# Patient Record
Sex: Female | Born: 1965 | Race: White | Hispanic: No | Marital: Married | Smoking: Never smoker
Health system: Southern US, Community
[De-identification: ages and names within clinical notes are randomized; demographics above are authoritative.]

---

## 2012-02-11 ENCOUNTER — Encounter (HOSPITAL_BASED_OUTPATIENT_CLINIC_OR_DEPARTMENT_OTHER): Payer: Self-pay

## 2012-02-11 ENCOUNTER — Emergency Department (HOSPITAL_BASED_OUTPATIENT_CLINIC_OR_DEPARTMENT_OTHER)
Admission: EM | Admit: 2012-02-11 | Discharge: 2012-02-11 | Disposition: A | Discharge status: Home / Self Care | Payer: 59 | Attending: Emergency Medicine | Admitting: Emergency Medicine

## 2012-02-11 DIAGNOSIS — R51 Headache: Secondary | ICD-10-CM | POA: Insufficient documentation

## 2012-02-11 MED ORDER — DIPHENHYDRAMINE HCL 50 MG/ML IJ SOLN
25.0000 mg | Freq: Once | INTRAMUSCULAR | Status: AC
  Administered 2012-02-11: 25 mg via INTRAVENOUS
  Filled 2012-02-11: qty 1

## 2012-02-11 MED ORDER — KETOROLAC TROMETHAMINE 15 MG/ML IJ SOLN
15.0000 mg | Freq: Once | INTRAMUSCULAR | Status: AC
  Administered 2012-02-11: 15 mg via INTRAVENOUS
  Filled 2012-02-11: qty 1

## 2012-02-11 MED ORDER — METOCLOPRAMIDE HCL 5 MG/ML IJ SOLN
10.0000 mg | Freq: Once | INTRAMUSCULAR | Status: AC
  Administered 2012-02-11: 10 mg via INTRAVENOUS
  Filled 2012-02-11: qty 2

## 2012-02-11 MED ORDER — SODIUM CHLORIDE 0.9 % IV BOLUS (SEPSIS)
1000.0000 mL | Freq: Once | INTRAVENOUS | Status: AC
  Administered 2012-02-11: 1000 mL via INTRAVENOUS

## 2012-02-11 MED ORDER — DROPERIDOL 2.5 MG/ML IJ SOLN
1.2500 mg | Freq: Once | INTRAMUSCULAR | Status: AC
  Administered 2012-02-11: 1.25 mg via INTRAVENOUS
  Filled 2012-02-11: qty 2

## 2012-02-11 NOTE — Discharge Instructions (Signed)
General Headache, Without Cause A general headache has no specific cause. These headaches are not life-threatening. They will not lead to other types of headaches. HOME CARE   Make and keep follow-up visits with your doctor.   Only take medicine as told by your doctor.   Try to relax, get a massage, or use your thoughts to control your body (biofeedback).   Apply cold or heat to the head and neck. Apply 3 or 4 times a day or as needed.  Finding out the results of your test Ask when your test results will be ready. Make sure you get your test results. GET HELP RIGHT AWAY IF:   You have problems with medicine.   Your medicine does not help relieve pain.   Your headache changes or becomes worse.   You feel sick to your stomach (nauseous) or throw up (vomit).   You have a temperature by mouth above 102 F (38.9 C), not controlled by medicine.   Your have a stiff neck.   You have vision loss.   You have muscle weakness.   You lose control of your muscles.   You lose balance or have trouble walking.   You feel like you are going to pass out (faint).  MAKE SURE YOU:   Understand these instructions.   Will watch this condition.   Will get help right away if you are not doing well or get worse.  Document Released: 09/22/2008 Document Revised: 08/26/2011 Document Reviewed: 09/22/2008 [employer] Patient Information 2012 [employer]. 

## 2012-02-11 NOTE — ED Notes (Signed)
MD at bedside. 

## 2012-02-11 NOTE — ED Provider Notes (Signed)
History    46 year old female presenting with headache. Patient has a history of what she calls migraines. This headache is similar in character to previous headaches. She has had increased frequency of these headaches though within the past 3 weeks. Most recent headache started yesterday. Gradual onset. Cannot remember what she was doing when she first noticed it. has been worse since early this morning. Constant. No appreciable exacerbating relieving factors. Mild nausea. No vomiting. No fevers or chills. Denies trauma. No neck pain or neck stiffness. No confusion per her husband. Patient is legally blind. No numbness, tingling or loss of strength. Denies history of cancer. Not on blood thinning medication.  CSN: [id number]  Arrival date & time 02/11/12  1914   First MD Initiated Contact with Patient 02/11/12 [phone]      No chief complaint on file.   (Consider location/radiation/quality/duration/timing/severity/associated sxs/prior treatment) HPI  No past medical history on file.  No past surgical history on file.  No family history on file.  History  Substance Use Topics  . Smoking status: Not on file  . Smokeless tobacco: Not on file  . Alcohol Use: Not on file    OB History    No data available      Review of Systems   Review of symptoms negative unless otherwise noted in HPI.   Allergies  Review of patient's allergies indicates not on file.  Home Medications  No current outpatient prescriptions on file.  BP 140/76  Pulse 69  Temp(Src) 97.8 F (36.6 C) (Oral)  Resp 16  Ht 5' 2.5" (1.588 m)  Wt 112 lb (50.803 kg)  BMI 20.16 kg/m2  SpO2 100%  Physical Exam  Nursing note and vitals reviewed. Constitutional: She is oriented to person, place, and time. She appears well-developed and well-nourished. No distress.  HENT:  Head: Normocephalic and atraumatic.  Eyes: Right eye exhibits no discharge. Left eye exhibits no discharge.       Disconjugate gaze  Neck:  Normal range of motion. Neck supple.       No midline spinal tenderness  Cardiovascular: Normal rate, regular rhythm and normal heart sounds.  Exam reveals no gallop and no friction rub.   No murmur heard. Pulmonary/Chest: Effort normal and breath sounds normal. No respiratory distress.  Abdominal: Soft. She exhibits no distension. There is no tenderness.  Musculoskeletal: She exhibits no edema and no tenderness.  Neurological: She is alert and oriented to person, place, and time. No cranial nerve deficit. She exhibits normal muscle tone. Coordination normal.       Patient is blind. Is no obvious cranial nerve deficit Otherwise Unable to assess finger to nose testing secondary to blindness. Heel-to-shin testing is normal bilaterally though.  Skin: Skin is warm and dry.  Psychiatric: She has a normal mood and affect. Her behavior is normal. Thought content normal.    ED Course  Procedures (including critical care time)  Labs Reviewed - No data to display No results found.  11:28 AM Patient reassessed. Patient states that her headache is much improved but she still has some mild pain. Patient states that she would like to go home pain-free. Explained her that this may not be reasonable but willing to try additional medications. Discussed other treatment options including narcotics, droperidol and others. Patient does not want narcotic at this time. We'll try a dose of droperidol and reassess.   1. Headache       MDM  46 year old female with headache. Similarity of current headache to  previous. Suspect primary headache. Consider secondary cause particularly with increased frequency. Consider mass, infectious or bleed but doubt. Consider carbon dioxide poisoning with time of year and frequency of headaches but no contacts with similar. Doubt carotid or vertebral artery dissection. Doubt ocular etiology. Aside from blindness neurological examination is otherwise nonfocal. She is at her  baseline mental status per her husband. Feel that neuroimaging would be very low yield without acute neurological change or history of trauma. Prior to discharge patient reports complete resolution of her headache. She has no new complaints. Return precautions were discussed. Outpatient followup.        Raeford Razor, MD 02/11/12 [phone]

## 2012-02-11 NOTE — ED Notes (Signed)
Attempted IV access x2 unsuccessful 

## 2012-02-11 NOTE — ED Notes (Signed)
Pt reports onset of "migraine" last night unrelieved after taking migraine medications.

## 2016-08-06 DIAGNOSIS — F411 Generalized anxiety disorder: Secondary | ICD-10-CM | POA: Insufficient documentation

## 2016-08-06 DIAGNOSIS — K219 Gastro-esophageal reflux disease without esophagitis: Secondary | ICD-10-CM | POA: Insufficient documentation

## 2016-08-06 DIAGNOSIS — H548 Legal blindness, as defined in USA: Secondary | ICD-10-CM | POA: Insufficient documentation

## 2016-08-06 DIAGNOSIS — E782 Mixed hyperlipidemia: Secondary | ICD-10-CM | POA: Insufficient documentation

## 2016-08-06 DIAGNOSIS — G43909 Migraine, unspecified, not intractable, without status migrainosus: Secondary | ICD-10-CM | POA: Insufficient documentation

## 2016-08-06 DIAGNOSIS — F410 Panic disorder [episodic paroxysmal anxiety] without agoraphobia: Secondary | ICD-10-CM | POA: Insufficient documentation

## 2016-09-05 ENCOUNTER — Emergency Department (HOSPITAL_BASED_OUTPATIENT_CLINIC_OR_DEPARTMENT_OTHER)
Admission: EM | Admit: 2016-09-05 | Discharge: 2016-09-05 | Disposition: A | Discharge status: Home / Self Care | Payer: BLUE CROSS/BLUE SHIELD | Attending: Emergency Medicine | Admitting: Emergency Medicine

## 2016-09-05 ENCOUNTER — Encounter (HOSPITAL_BASED_OUTPATIENT_CLINIC_OR_DEPARTMENT_OTHER): Payer: Self-pay | Admitting: Adult Health

## 2016-09-05 ENCOUNTER — Emergency Department (HOSPITAL_BASED_OUTPATIENT_CLINIC_OR_DEPARTMENT_OTHER): Payer: BLUE CROSS/BLUE SHIELD

## 2016-09-05 DIAGNOSIS — S59912A Unspecified injury of left forearm, initial encounter: Secondary | ICD-10-CM | POA: Diagnosis present

## 2016-09-05 DIAGNOSIS — S52125A Nondisplaced fracture of head of left radius, initial encounter for closed fracture: Secondary | ICD-10-CM | POA: Diagnosis not present

## 2016-09-05 DIAGNOSIS — Y9355 Activity, bike riding: Secondary | ICD-10-CM | POA: Diagnosis not present

## 2016-09-05 DIAGNOSIS — Z79899 Other long term (current) drug therapy: Secondary | ICD-10-CM | POA: Insufficient documentation

## 2016-09-05 DIAGNOSIS — S52122A Displaced fracture of head of left radius, initial encounter for closed fracture: Secondary | ICD-10-CM

## 2016-09-05 DIAGNOSIS — Y999 Unspecified external cause status: Secondary | ICD-10-CM | POA: Diagnosis not present

## 2016-09-05 DIAGNOSIS — Y92410 Unspecified street and highway as the place of occurrence of the external cause: Secondary | ICD-10-CM | POA: Insufficient documentation

## 2016-09-05 MED ORDER — HYDROCODONE-ACETAMINOPHEN 5-325 MG PO TABS: 2.0000 | ORAL_TABLET | Freq: Four times a day (QID) | ORAL | 0 refills | Status: AC | PRN

## 2016-09-05 MED ORDER — IBUPROFEN 800 MG PO TABS: 800.0000 mg | ORAL_TABLET | Freq: Four times a day (QID) | ORAL | 0 refills | Status: AC
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
## 2016-09-08 ENCOUNTER — Encounter: Payer: Self-pay | Admitting: Family Medicine

## 2016-09-08 ENCOUNTER — Ambulatory Visit (INDEPENDENT_AMBULATORY_CARE_PROVIDER_SITE_OTHER): Payer: BLUE CROSS/BLUE SHIELD | Admitting: Family Medicine

## 2016-09-08 DIAGNOSIS — S59902A Unspecified injury of left elbow, initial encounter: Secondary | ICD-10-CM

## 2016-09-08 NOTE — Patient Instructions (Addendum)
You have a radial head fracture but most of your pain is due to the swelling associated with this. Continue the motion exercises as you have been at least twice a day. Icing only as needed now 3-4 times a day for 15-20 minutes at a time. Ibuprofen only if needed now. Follow up with me in 2 weeks. We will likely add strengthening exercises with light weights at that time if you're doing well.

## 2016-09-10 DIAGNOSIS — S59902A Unspecified injury of left elbow, initial encounter: Secondary | ICD-10-CM | POA: Insufficient documentation

## 2016-09-10 NOTE — Assessment & Plan Note (Signed)
independently reviewed radiographs - effusion confirmed, subtle radial head fracture though not well seen on most views.  Emphasized motion exercises.  Icing and ibuprofen discussed.  F/u in 2 weeks - hope to add strengthening at that time if pain has continued to improve.
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
## 2016-09-22 ENCOUNTER — Ambulatory Visit (INDEPENDENT_AMBULATORY_CARE_PROVIDER_SITE_OTHER): Payer: BLUE CROSS/BLUE SHIELD | Admitting: Family Medicine

## 2016-09-22 ENCOUNTER — Encounter: Payer: Self-pay | Admitting: Family Medicine

## 2016-09-22 DIAGNOSIS — S59902D Unspecified injury of left elbow, subsequent encounter: Secondary | ICD-10-CM | POA: Diagnosis not present

## 2016-09-22 NOTE — Patient Instructions (Signed)
You have a radial head fracture. Continue the motion exercises as you have been at least twice a day. Wrist flexion and extension, arm curls, and hammer rotation exercises 3 sets of 10 once a day. Icing only as needed now 3-4 times a day for 15-20 minutes at a time. Ibuprofen only if needed. Follow up with me in 4 weeks.

## 2016-09-24 NOTE — Assessment & Plan Note (Signed)
independently reviewed radiographs previously with effusion, subtle radial head fracture.  She is doing well clinically - will add strengthening exercises which were shown to her today.  Icing, tylenol or ibuprofen if needed.  F/u in 4 weeks.  Consider physical therapy if not improving as expected.
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
## 2016-10-23 ENCOUNTER — Encounter (INDEPENDENT_AMBULATORY_CARE_PROVIDER_SITE_OTHER): Payer: Self-pay

## 2016-10-23 ENCOUNTER — Encounter: Payer: Self-pay | Admitting: Family Medicine

## 2016-10-23 ENCOUNTER — Ambulatory Visit (INDEPENDENT_AMBULATORY_CARE_PROVIDER_SITE_OTHER): Payer: BLUE CROSS/BLUE SHIELD | Admitting: Family Medicine

## 2016-10-23 ENCOUNTER — Ambulatory Visit: Payer: BLUE CROSS/BLUE SHIELD | Admitting: Family Medicine

## 2016-10-23 DIAGNOSIS — S59902D Unspecified injury of left elbow, subsequent encounter: Secondary | ICD-10-CM | POA: Diagnosis not present
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
## 2016-10-23 NOTE — Patient Instructions (Addendum)
Follow up with me as needed. You can return to the gym without restrictions.

## 2016-10-24 NOTE — Assessment & Plan Note (Signed)
Radial head fracture - clinically healed at this point.  Cleared for all activities without restrictions.  Ok to ice, take tylenol or ibuprofen only if needed.  F/u prn.

## 2017-10-20 IMAGING — DX DG ELBOW COMPLETE 3+V*L*
4 series · 4 of 4 positions shown · non-contrast
Comparison: None.

CLINICAL DATA: Pain after fall

EXAM:
LEFT ELBOW - COMPLETE 3+ VIEW

[elbow ap]
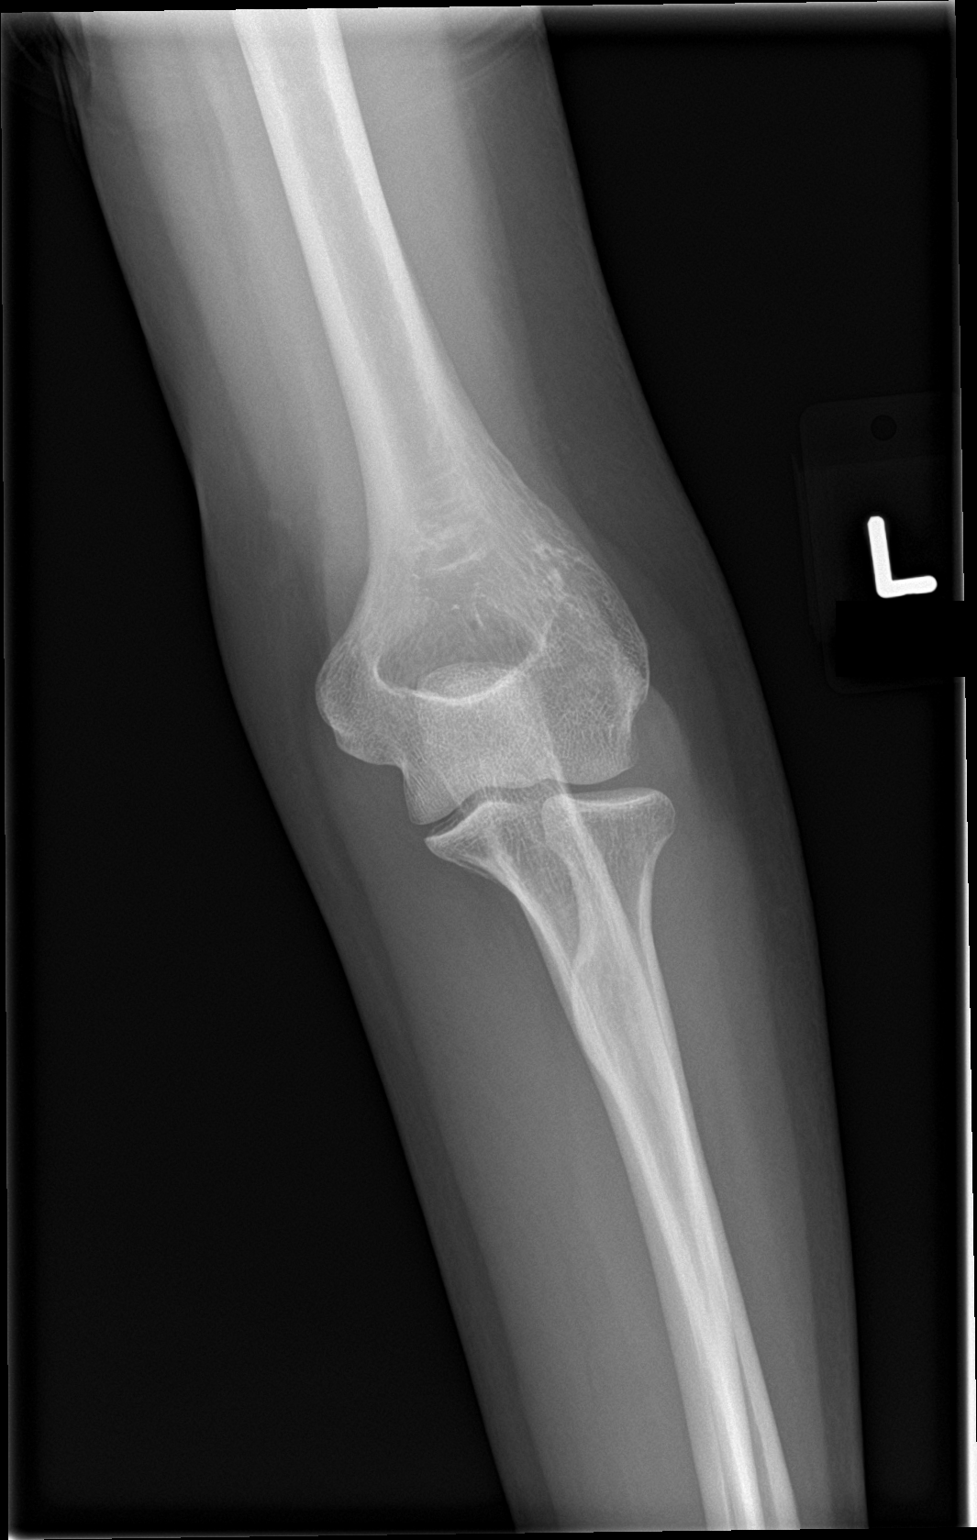

[elbow obl (1 of 2)]
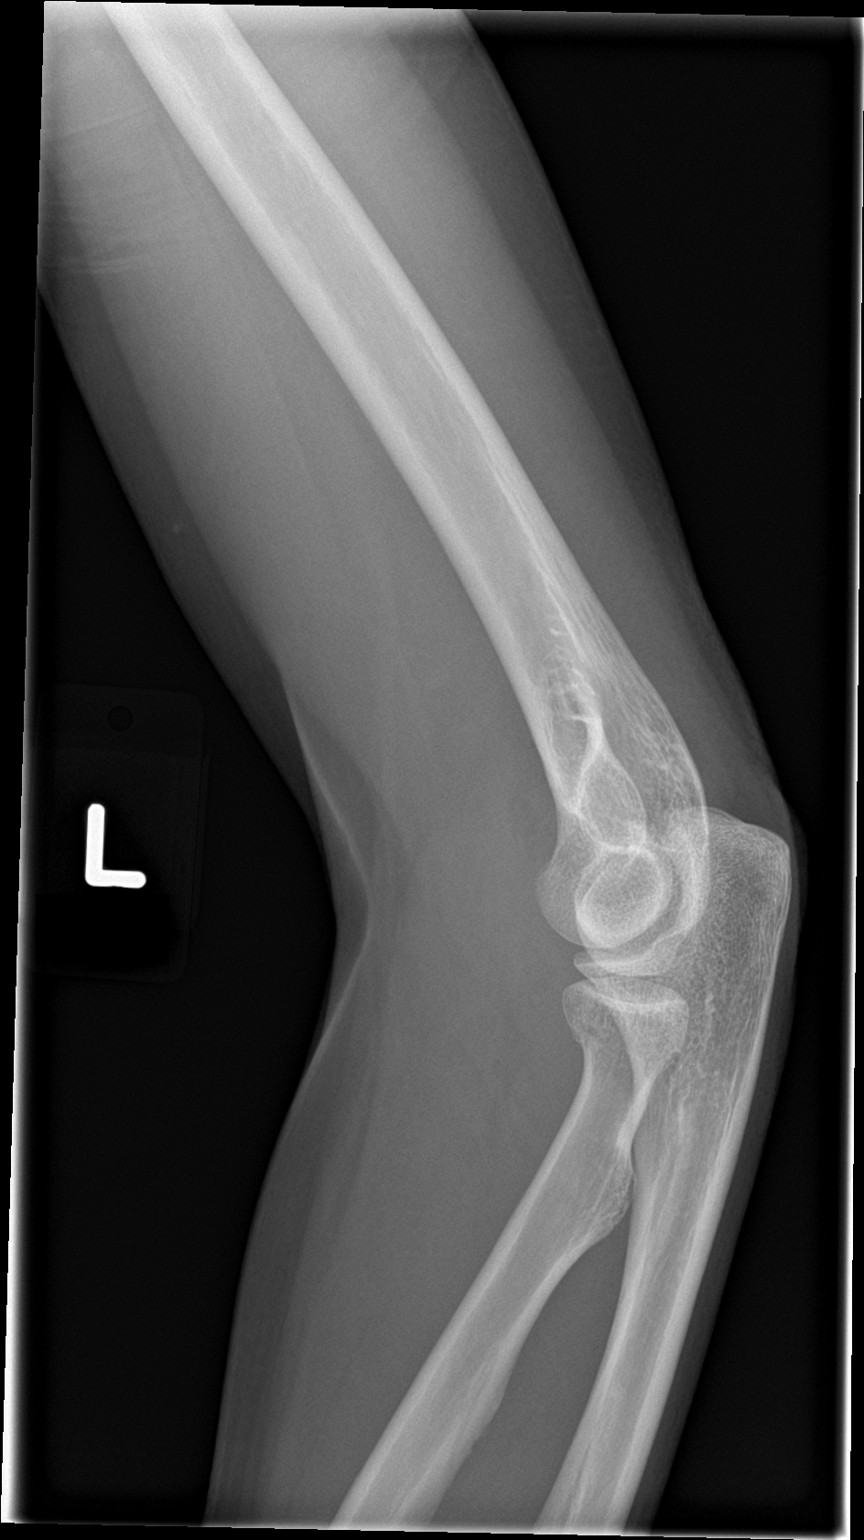

[elbow obl (2 of 2)]
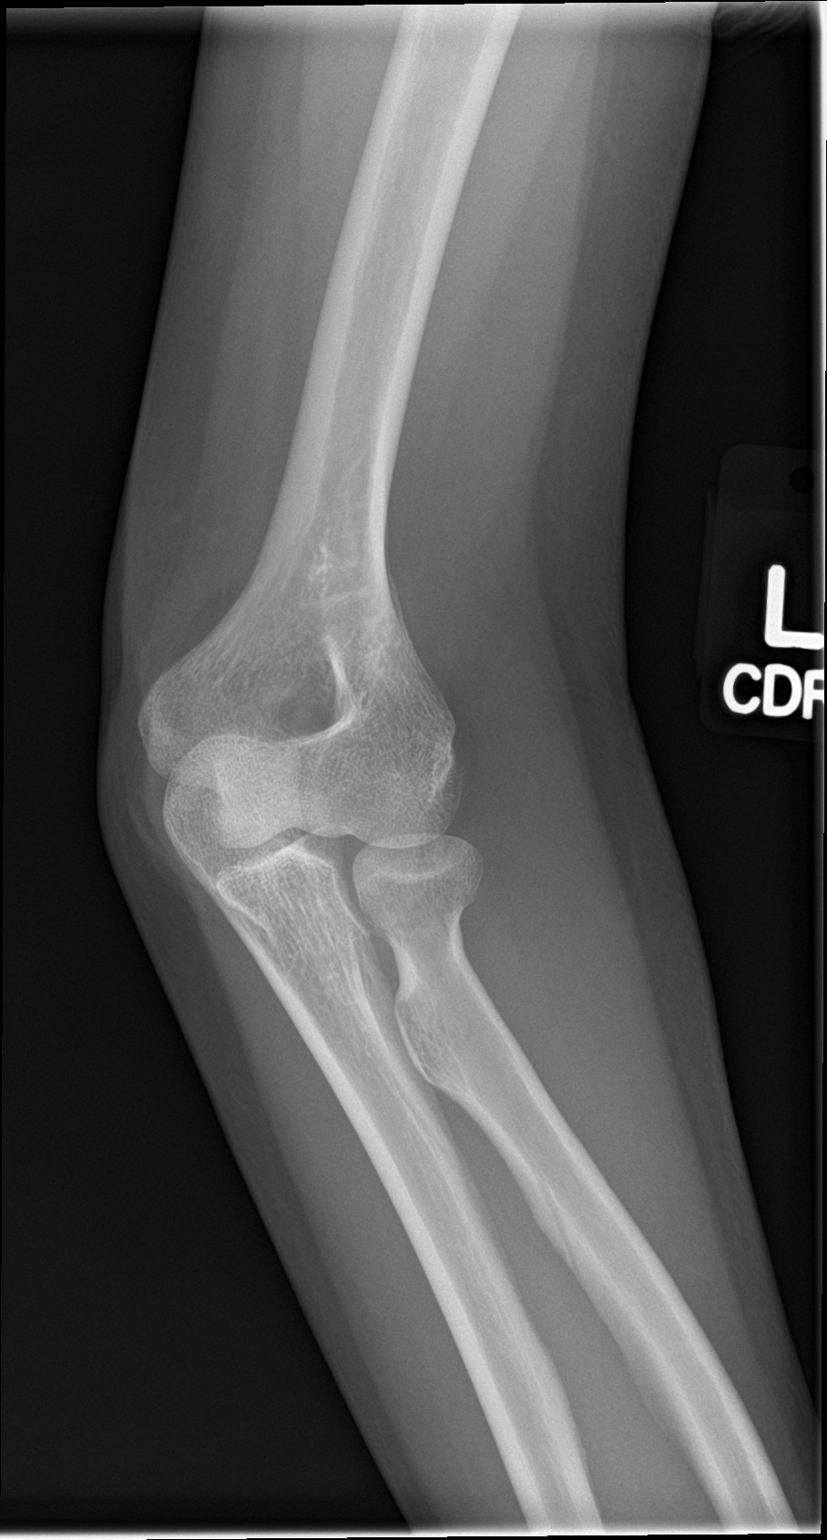

[elbow lat]
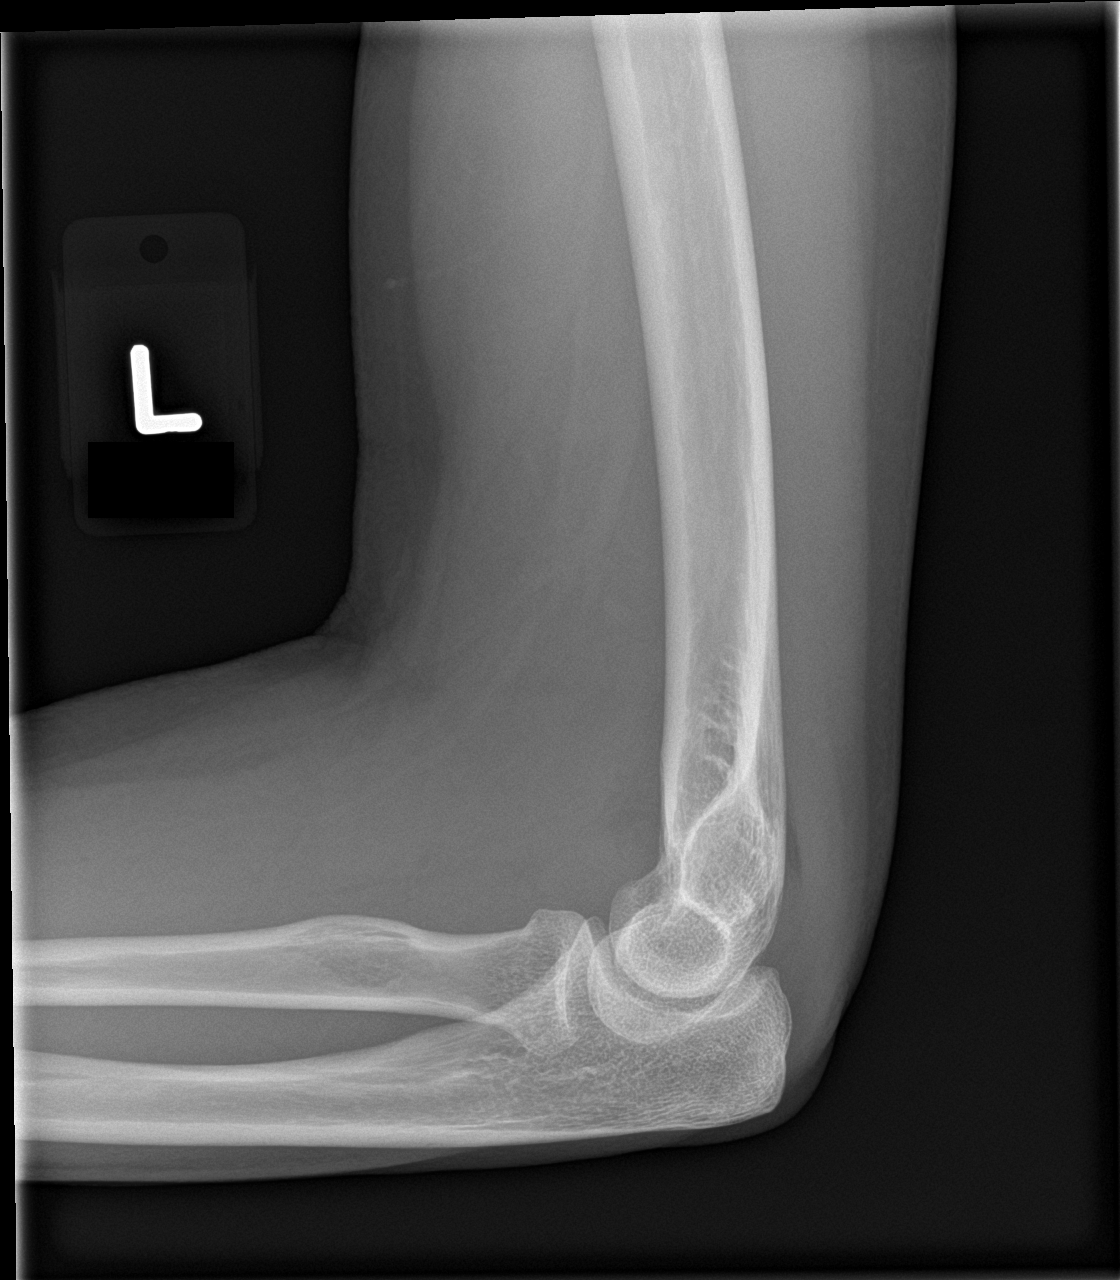

[4 of 4 positions shown; findings below may reference images not displayed]

FINDINGS: There is a subtle fracture through the radial head without
displacement. There is a resulting joint effusion.
IMPRESSION: Subtle nondisplaced radial head fracture with a resulting joint
effusion.

## 2020-01-23 DIAGNOSIS — L820 Inflamed seborrheic keratosis: Secondary | ICD-10-CM | POA: Diagnosis not present

## 2020-01-26 DIAGNOSIS — M8580 Other specified disorders of bone density and structure, unspecified site: Secondary | ICD-10-CM | POA: Diagnosis not present

## 2020-01-26 DIAGNOSIS — E782 Mixed hyperlipidemia: Secondary | ICD-10-CM | POA: Diagnosis not present

## 2020-01-30 DIAGNOSIS — F411 Generalized anxiety disorder: Secondary | ICD-10-CM | POA: Diagnosis not present

## 2020-01-30 DIAGNOSIS — F410 Panic disorder [episodic paroxysmal anxiety] without agoraphobia: Secondary | ICD-10-CM | POA: Diagnosis not present

## 2020-01-30 DIAGNOSIS — Z0000 Encounter for general adult medical examination without abnormal findings: Secondary | ICD-10-CM | POA: Diagnosis not present

## 2020-01-30 DIAGNOSIS — K219 Gastro-esophageal reflux disease without esophagitis: Secondary | ICD-10-CM | POA: Diagnosis not present

## 2020-01-30 DIAGNOSIS — Z79899 Other long term (current) drug therapy: Secondary | ICD-10-CM | POA: Diagnosis not present

## 2020-01-30 DIAGNOSIS — D709 Neutropenia, unspecified: Secondary | ICD-10-CM | POA: Diagnosis not present

## 2020-01-30 DIAGNOSIS — G43119 Migraine with aura, intractable, without status migrainosus: Secondary | ICD-10-CM | POA: Diagnosis not present

## 2020-01-30 DIAGNOSIS — Z136 Encounter for screening for cardiovascular disorders: Secondary | ICD-10-CM | POA: Diagnosis not present

## 2020-01-30 DIAGNOSIS — J340 Abscess, furuncle and carbuncle of nose: Secondary | ICD-10-CM | POA: Diagnosis not present

## 2020-01-30 DIAGNOSIS — E782 Mixed hyperlipidemia: Secondary | ICD-10-CM | POA: Diagnosis not present

## 2020-01-31 DIAGNOSIS — J340 Abscess, furuncle and carbuncle of nose: Secondary | ICD-10-CM | POA: Diagnosis not present

## 2020-01-31 DIAGNOSIS — R55 Syncope and collapse: Secondary | ICD-10-CM | POA: Diagnosis not present

## 2020-03-13 DIAGNOSIS — H3552 Pigmentary retinal dystrophy: Secondary | ICD-10-CM | POA: Diagnosis not present

## 2020-03-13 DIAGNOSIS — H25813 Combined forms of age-related cataract, bilateral: Secondary | ICD-10-CM | POA: Diagnosis not present

## 2020-03-13 DIAGNOSIS — H04123 Dry eye syndrome of bilateral lacrimal glands: Secondary | ICD-10-CM | POA: Diagnosis not present

## 2020-04-09 DIAGNOSIS — D709 Neutropenia, unspecified: Secondary | ICD-10-CM | POA: Diagnosis not present

## 2020-04-09 DIAGNOSIS — Z87440 Personal history of urinary (tract) infections: Secondary | ICD-10-CM | POA: Diagnosis not present

## 2020-04-16 DIAGNOSIS — L821 Other seborrheic keratosis: Secondary | ICD-10-CM | POA: Diagnosis not present

## 2020-04-16 DIAGNOSIS — L820 Inflamed seborrheic keratosis: Secondary | ICD-10-CM | POA: Diagnosis not present

## 2020-04-16 DIAGNOSIS — D485 Neoplasm of uncertain behavior of skin: Secondary | ICD-10-CM | POA: Diagnosis not present

## 2020-06-21 DIAGNOSIS — Z1231 Encounter for screening mammogram for malignant neoplasm of breast: Secondary | ICD-10-CM | POA: Diagnosis not present

## 2020-08-20 DIAGNOSIS — C4441 Basal cell carcinoma of skin of scalp and neck: Secondary | ICD-10-CM | POA: Diagnosis not present

## 2020-08-20 DIAGNOSIS — D1801 Hemangioma of skin and subcutaneous tissue: Secondary | ICD-10-CM | POA: Diagnosis not present

## 2020-08-20 DIAGNOSIS — L821 Other seborrheic keratosis: Secondary | ICD-10-CM | POA: Diagnosis not present

## 2020-08-20 DIAGNOSIS — L570 Actinic keratosis: Secondary | ICD-10-CM | POA: Diagnosis not present

## 2020-08-20 DIAGNOSIS — D225 Melanocytic nevi of trunk: Secondary | ICD-10-CM | POA: Diagnosis not present

## 2020-08-20 DIAGNOSIS — Z85828 Personal history of other malignant neoplasm of skin: Secondary | ICD-10-CM | POA: Diagnosis not present

## 2021-02-06 DIAGNOSIS — M8580 Other specified disorders of bone density and structure, unspecified site: Secondary | ICD-10-CM | POA: Diagnosis not present

## 2021-02-06 DIAGNOSIS — D709 Neutropenia, unspecified: Secondary | ICD-10-CM | POA: Diagnosis not present

## 2021-02-06 DIAGNOSIS — E782 Mixed hyperlipidemia: Secondary | ICD-10-CM | POA: Diagnosis not present

## 2021-02-11 DIAGNOSIS — N951 Menopausal and female climacteric states: Secondary | ICD-10-CM | POA: Diagnosis not present

## 2021-02-11 DIAGNOSIS — M8580 Other specified disorders of bone density and structure, unspecified site: Secondary | ICD-10-CM | POA: Diagnosis not present

## 2021-02-11 DIAGNOSIS — D708 Other neutropenia: Secondary | ICD-10-CM | POA: Diagnosis not present

## 2021-02-11 DIAGNOSIS — K219 Gastro-esophageal reflux disease without esophagitis: Secondary | ICD-10-CM | POA: Diagnosis not present

## 2021-02-11 DIAGNOSIS — F411 Generalized anxiety disorder: Secondary | ICD-10-CM | POA: Diagnosis not present

## 2021-02-11 DIAGNOSIS — F410 Panic disorder [episodic paroxysmal anxiety] without agoraphobia: Secondary | ICD-10-CM | POA: Diagnosis not present

## 2021-02-11 DIAGNOSIS — Z79899 Other long term (current) drug therapy: Secondary | ICD-10-CM | POA: Diagnosis not present

## 2021-02-11 DIAGNOSIS — G43119 Migraine with aura, intractable, without status migrainosus: Secondary | ICD-10-CM | POA: Diagnosis not present

## 2021-02-11 DIAGNOSIS — Z0000 Encounter for general adult medical examination without abnormal findings: Secondary | ICD-10-CM | POA: Diagnosis not present

## 2021-02-11 DIAGNOSIS — H548 Legal blindness, as defined in USA: Secondary | ICD-10-CM | POA: Diagnosis not present

## 2021-02-11 DIAGNOSIS — E782 Mixed hyperlipidemia: Secondary | ICD-10-CM | POA: Diagnosis not present

## 2021-02-13 DIAGNOSIS — Z85828 Personal history of other malignant neoplasm of skin: Secondary | ICD-10-CM | POA: Diagnosis not present

## 2021-02-13 DIAGNOSIS — L821 Other seborrheic keratosis: Secondary | ICD-10-CM | POA: Diagnosis not present

## 2021-02-13 DIAGNOSIS — D225 Melanocytic nevi of trunk: Secondary | ICD-10-CM | POA: Diagnosis not present

## 2021-02-13 DIAGNOSIS — C4441 Basal cell carcinoma of skin of scalp and neck: Secondary | ICD-10-CM | POA: Diagnosis not present

## 2021-02-27 DIAGNOSIS — H6122 Impacted cerumen, left ear: Secondary | ICD-10-CM | POA: Diagnosis not present

## 2021-05-06 DIAGNOSIS — R300 Dysuria: Secondary | ICD-10-CM | POA: Diagnosis not present

## 2021-06-23 DIAGNOSIS — M8589 Other specified disorders of bone density and structure, multiple sites: Secondary | ICD-10-CM | POA: Diagnosis not present

## 2021-06-23 DIAGNOSIS — Z780 Asymptomatic menopausal state: Secondary | ICD-10-CM | POA: Diagnosis not present

## 2021-06-23 DIAGNOSIS — Z1231 Encounter for screening mammogram for malignant neoplasm of breast: Secondary | ICD-10-CM | POA: Diagnosis not present

## 2021-06-23 DIAGNOSIS — M810 Age-related osteoporosis without current pathological fracture: Secondary | ICD-10-CM | POA: Diagnosis not present

## 2021-09-11 DIAGNOSIS — D1801 Hemangioma of skin and subcutaneous tissue: Secondary | ICD-10-CM | POA: Diagnosis not present

## 2021-09-11 DIAGNOSIS — Z85828 Personal history of other malignant neoplasm of skin: Secondary | ICD-10-CM | POA: Diagnosis not present

## 2021-09-11 DIAGNOSIS — L821 Other seborrheic keratosis: Secondary | ICD-10-CM | POA: Diagnosis not present

## 2021-09-11 DIAGNOSIS — L570 Actinic keratosis: Secondary | ICD-10-CM | POA: Diagnosis not present

## 2021-09-11 DIAGNOSIS — D225 Melanocytic nevi of trunk: Secondary | ICD-10-CM | POA: Diagnosis not present

## 2021-09-11 DIAGNOSIS — L700 Acne vulgaris: Secondary | ICD-10-CM | POA: Diagnosis not present

## 2021-09-11 DIAGNOSIS — L820 Inflamed seborrheic keratosis: Secondary | ICD-10-CM | POA: Diagnosis not present

## 2021-09-11 DIAGNOSIS — D224 Melanocytic nevi of scalp and neck: Secondary | ICD-10-CM | POA: Diagnosis not present

## 2021-10-10 DIAGNOSIS — M79642 Pain in left hand: Secondary | ICD-10-CM | POA: Diagnosis not present

## 2021-10-10 DIAGNOSIS — M25532 Pain in left wrist: Secondary | ICD-10-CM | POA: Diagnosis not present

## 2021-11-02 DIAGNOSIS — S63002A Unspecified subluxation of left wrist and hand, initial encounter: Secondary | ICD-10-CM | POA: Diagnosis not present

## 2021-11-02 DIAGNOSIS — M25532 Pain in left wrist: Secondary | ICD-10-CM | POA: Diagnosis not present

## 2021-11-02 DIAGNOSIS — M19032 Primary osteoarthritis, left wrist: Secondary | ICD-10-CM | POA: Diagnosis not present

## 2021-11-02 DIAGNOSIS — M189 Osteoarthritis of first carpometacarpal joint, unspecified: Secondary | ICD-10-CM | POA: Diagnosis not present

## 2021-11-02 DIAGNOSIS — G8929 Other chronic pain: Secondary | ICD-10-CM | POA: Diagnosis not present

## 2021-11-12 DIAGNOSIS — M25532 Pain in left wrist: Secondary | ICD-10-CM | POA: Diagnosis not present

## 2021-11-12 DIAGNOSIS — G8929 Other chronic pain: Secondary | ICD-10-CM | POA: Diagnosis not present

## 2021-11-14 DIAGNOSIS — K219 Gastro-esophageal reflux disease without esophagitis: Secondary | ICD-10-CM | POA: Diagnosis not present

## 2021-11-14 DIAGNOSIS — Z79899 Other long term (current) drug therapy: Secondary | ICD-10-CM | POA: Diagnosis not present

## 2021-11-14 DIAGNOSIS — H548 Legal blindness, as defined in USA: Secondary | ICD-10-CM | POA: Diagnosis not present

## 2021-11-14 DIAGNOSIS — F410 Panic disorder [episodic paroxysmal anxiety] without agoraphobia: Secondary | ICD-10-CM | POA: Diagnosis not present

## 2021-11-14 DIAGNOSIS — F411 Generalized anxiety disorder: Secondary | ICD-10-CM | POA: Diagnosis not present

## 2021-11-14 DIAGNOSIS — N951 Menopausal and female climacteric states: Secondary | ICD-10-CM | POA: Diagnosis not present

## 2021-11-14 DIAGNOSIS — E782 Mixed hyperlipidemia: Secondary | ICD-10-CM | POA: Diagnosis not present

## 2021-11-14 DIAGNOSIS — D708 Other neutropenia: Secondary | ICD-10-CM | POA: Diagnosis not present
